# Patient Record
Sex: Female | Born: 1970 | Race: White | Hispanic: No | Marital: Married | Smoking: Never smoker
Health system: Southern US, Community
[De-identification: ages and names within clinical notes are randomized; demographics above are authoritative.]

## PROBLEM LIST (undated history)

## (undated) DIAGNOSIS — O039 Complete or unspecified spontaneous abortion without complication: Secondary | ICD-10-CM

## (undated) HISTORY — PX: DILATION AND CURETTAGE OF UTERUS: SHX78

## (undated) HISTORY — PX: NASAL SINUS SURGERY: SHX719

---

## 1999-02-04 ENCOUNTER — Other Ambulatory Visit: Admission: RE | Admit: 1999-02-04 | Discharge: 1999-02-04 | Payer: Self-pay | Admitting: Obstetrics and Gynecology

## 1999-08-16 ENCOUNTER — Other Ambulatory Visit: Admission: RE | Admit: 1999-08-16 | Discharge: 1999-08-16 | Payer: Self-pay | Admitting: Obstetrics and Gynecology

## 2000-03-23 ENCOUNTER — Other Ambulatory Visit: Admission: RE | Admit: 2000-03-23 | Discharge: 2000-03-23 | Payer: Self-pay | Admitting: *Deleted

## 2009-01-02 ENCOUNTER — Observation Stay: Payer: Self-pay

## 2015-10-03 ENCOUNTER — Encounter: Payer: Self-pay | Admitting: Emergency Medicine

## 2015-10-03 ENCOUNTER — Emergency Department: Payer: BC Managed Care – PPO

## 2015-10-03 ENCOUNTER — Emergency Department
Admission: EM | Admit: 2015-10-03 | Discharge: 2015-10-03 | Disposition: A | Discharge status: Home / Self Care | Payer: BC Managed Care – PPO | Attending: Emergency Medicine | Admitting: Emergency Medicine

## 2015-10-03 DIAGNOSIS — S20219A Contusion of unspecified front wall of thorax, initial encounter: Secondary | ICD-10-CM | POA: Insufficient documentation

## 2015-10-03 DIAGNOSIS — Y92410 Unspecified street and highway as the place of occurrence of the external cause: Secondary | ICD-10-CM | POA: Diagnosis not present

## 2015-10-03 DIAGNOSIS — Y998 Other external cause status: Secondary | ICD-10-CM | POA: Diagnosis not present

## 2015-10-03 DIAGNOSIS — Y9389 Activity, other specified: Secondary | ICD-10-CM | POA: Insufficient documentation

## 2015-10-03 DIAGNOSIS — Z7951 Long term (current) use of inhaled steroids: Secondary | ICD-10-CM | POA: Insufficient documentation

## 2015-10-03 DIAGNOSIS — S199XXA Unspecified injury of neck, initial encounter: Secondary | ICD-10-CM | POA: Diagnosis present

## 2015-10-03 DIAGNOSIS — S161XXA Strain of muscle, fascia and tendon at neck level, initial encounter: Secondary | ICD-10-CM | POA: Diagnosis not present

## 2015-10-03 HISTORY — DX: Complete or unspecified spontaneous abortion without complication: O03.9

## 2015-10-03 LAB — TROPONIN I: Troponin I: <0.03 ng/mL (ref <0.031)

## 2015-10-03 MED ORDER — CYCLOBENZAPRINE HCL 5 MG PO TABS: 5.0000 mg | ORAL_TABLET | Freq: Three times a day (TID) | ORAL | Status: AC | PRN

## 2015-10-03 MED ORDER — IBUPROFEN 800 MG PO TABS: 800.0000 mg | ORAL_TABLET | Freq: Three times a day (TID) | ORAL | Status: AC | PRN

## 2015-10-03 NOTE — ED Provider Notes (Signed)
-----------------------------------------   9:58 PM on 10/03/2015 -----------------------------------------  Asked to place a EKG read, did not otherwise see the patient. Rate 60 bpm no acute ST elevation or acute ST depression, nonspecific ST changes noted inferiorly and laterally.  Jeanmarie Plant, MD 10/03/15 2158

## 2015-10-03 NOTE — Discharge Instructions (Signed)
Cervical Sprain °A cervical sprain is an injury in the neck in which the strong, fibrous tissues (ligaments) that connect your neck bones stretch or tear. Cervical sprains can range from mild to severe. Severe cervical sprains can cause the neck vertebrae to be unstable. This can lead to damage of the spinal cord and can result in serious nervous system problems. The amount of time it takes for a cervical sprain to get better depends on the cause and extent of the injury. Most cervical sprains heal in 1 to 3 weeks. °CAUSES  °Severe cervical sprains may be caused by:  °· Contact sport injuries (such as from football, rugby, wrestling, hockey, auto racing, gymnastics, diving, martial arts, or boxing).   °· Motor vehicle collisions.   °· Whiplash injuries. This is an injury from a sudden forward and backward whipping movement of the head and neck.  °· Falls.   °Mild cervical sprains may be caused by:  °· Being in an awkward position, such as while cradling a telephone between your ear and shoulder.   °· Sitting in a chair that does not offer proper support.   °· Working at a poorly designed computer station.   °· Looking up or down for long periods of time.   °SYMPTOMS  °· Pain, soreness, stiffness, or a burning sensation in the front, back, or sides of the neck. This discomfort may develop immediately after the injury or slowly, 24 hours or more after the injury.   °· Pain or tenderness directly in the middle of the back of the neck.   °· Shoulder or upper back pain.   °· Limited ability to move the neck.   °· Headache.   °· Dizziness.   °· Weakness, numbness, or tingling in the hands or arms.   °· Muscle spasms.   °· Difficulty swallowing or chewing.   °· Tenderness and swelling of the neck.   °DIAGNOSIS  °Most of the time your health care provider can diagnose a cervical sprain by taking your history and doing a physical exam. Your health care provider will ask about previous neck injuries and any known neck  problems, such as arthritis in the neck. X-rays may be taken to find out if there are any other problems, such as with the bones of the neck. Other tests, such as a CT scan or MRI, may also be needed.  °TREATMENT  °Treatment depends on the severity of the cervical sprain. Mild sprains can be treated with rest, keeping the neck in place (immobilization), and pain medicines. Severe cervical sprains are immediately immobilized. Further treatment is done to help with pain, muscle spasms, and other symptoms and may include: °· Medicines, such as pain relievers, numbing medicines, or muscle relaxants.   °· Physical therapy. This may involve stretching exercises, strengthening exercises, and posture training. Exercises and improved posture can help stabilize the neck, strengthen muscles, and help stop symptoms from returning.   °HOME CARE INSTRUCTIONS  °· Put ice on the injured area.   °¨ Put ice in a plastic bag.   °¨ Place a towel between your skin and the bag.   °¨ Leave the ice on for 15-20 minutes, 3-4 times a day.   °· If your injury was severe, you may have been given a cervical collar to wear. A cervical collar is a two-piece collar designed to keep your neck from moving while it heals. °¨ Do not remove the collar unless instructed by your health care provider. °¨ If you have long hair, keep it outside of the collar. °¨ Ask your health care provider before making any adjustments to your collar. Minor   adjustments may be required over time to improve comfort and reduce pressure on your chin or on the back of your head. °¨ If you are allowed to remove the collar for cleaning or bathing, follow your health care provider's instructions on how to do so safely. °¨ Keep your collar clean by wiping it with mild soap and water and drying it completely. If the collar you have been given includes removable pads, remove them every 1-2 days and hand wash them with soap and water. Allow them to air dry. They should be completely  dry before you wear them in the collar. °¨ If you are allowed to remove the collar for cleaning and bathing, wash and dry the skin of your neck. Check your skin for irritation or sores. If you see any, tell your health care provider. °¨ Do not drive while wearing the collar.   °· Only take over-the-counter or prescription medicines for pain, discomfort, or fever as directed by your health care provider.   °· Keep all follow-up appointments as directed by your health care provider.   °· Keep all physical therapy appointments as directed by your health care provider.   °· Make any needed adjustments to your workstation to promote good posture.   °· Avoid positions and activities that make your symptoms worse.   °· Warm up and stretch before being active to help prevent problems.   °SEEK MEDICAL CARE IF:  °· Your pain is not controlled with medicine.   °· You are unable to decrease your pain medicine over time as planned.   °· Your activity level is not improving as expected.   °SEEK IMMEDIATE MEDICAL CARE IF:  °· You develop any bleeding. °· You develop stomach upset. °· You have signs of an allergic reaction to your medicine.   °· Your symptoms get worse.   °· You develop new, unexplained symptoms.   °· You have numbness, tingling, weakness, or paralysis in any part of your body.   °MAKE SURE YOU:  °· Understand these instructions. °· Will watch your condition. °· Will get help right away if you are not doing well or get worse. °  °This information is not intended to replace advice given to you by your health care provider. Make sure you discuss any questions you have with your health care provider. °  °Document Released: 06/12/2007 Document Revised: 08/20/2013 Document Reviewed: 02/20/2013 °[employer]vier Interactive Patient Education ©2016 [employer] ° °Chest Contusion °A chest contusion is a deep bruise on your chest area. Contusions are the result of an injury that caused bleeding under the skin. A chest contusion  may involve bruising of the skin, muscles, or ribs. The contusion may turn blue, purple, or yellow. Minor injuries will give you a painless contusion, but more severe contusions may stay painful and swollen for a few weeks. °CAUSES  °A contusion is usually caused by a blow, trauma, or direct force to an area of the body. °SYMPTOMS  °· Swelling and redness of the injured area. °· Discoloration of the injured area. °· Tenderness and soreness of the injured area. °· Pain. °DIAGNOSIS  °The diagnosis can be made by taking a history and performing a physical exam. An X-ray, CT scan, or MRI may be needed to determine if there were any associated injuries, such as broken bones (fractures) or internal injuries. °TREATMENT  °Often, the best treatment for a chest contusion is resting, icing, and applying cold compresses to the injured area. Deep breathing exercises may be recommended to reduce the risk of pneumonia. Over-the-counter medicines may also be recommended   for pain control. °HOME CARE INSTRUCTIONS  °· Put ice on the injured area. °¨ Put ice in a plastic bag. °¨ Place a towel between your skin and the bag. °¨ Leave the ice on for 15-20 minutes, 03-04 times a day. °· Only take over-the-counter or prescription medicines as directed by your caregiver. Your caregiver may recommend avoiding anti-inflammatory medicines (aspirin, ibuprofen, and naproxen) for 48 hours because these medicines may increase bruising. °· Rest the injured area. °· Perform deep-breathing exercises as directed by your caregiver. °· Stop smoking if you smoke. °· Do not lift objects over 5 pounds (2.3 kg) for 3 days or longer if recommended by your caregiver. °SEEK IMMEDIATE MEDICAL CARE IF:  °· You have increased bruising or swelling. °· You have pain that is getting worse. °· You have difficulty breathing. °· You have dizziness, weakness, or fainting. °· You have blood in your urine or stool. °· You cough up or vomit blood. °· Your swelling or pain  is not relieved with medicines. °MAKE SURE YOU:  °· Understand these instructions. °· Will watch your condition. °· Will get help right away if you are not doing well or get worse. °  °This information is not intended to replace advice given to you by your health care provider. Make sure you discuss any questions you have with your health care provider. °  °Document Released: 05/10/2001 Document Revised: 05/09/2012 Document Reviewed: 02/06/2012 °Elsevier Interactive Patient Education ©2016 [employer] ° °
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
